# Patient Record
Sex: Male | Born: 1970 | Hispanic: Yes | Smoking: Never smoker
Health system: Southern US, Community
[De-identification: ages and names within clinical notes are randomized; demographics above are authoritative.]

---

## 2015-09-24 ENCOUNTER — Emergency Department: Payer: Self-pay

## 2015-09-24 ENCOUNTER — Inpatient Hospital Stay
Admit: 2015-09-24 | Discharge: 2015-10-03 | DRG: 885 | Disposition: A | Discharge status: Home / Self Care | Payer: Self-pay | Source: Ambulatory Visit | Attending: Psychiatry | Admitting: Psychiatry

## 2015-09-24 ENCOUNTER — Emergency Department
Admission: EM | Admit: 2015-09-24 | Discharge: 2015-09-24 | Disposition: A | Discharge status: Other Acute Inpatient Hospital | Payer: Self-pay | Attending: Emergency Medicine | Admitting: Emergency Medicine

## 2015-09-24 DIAGNOSIS — F515 Nightmare disorder: Secondary | ICD-10-CM | POA: Diagnosis present

## 2015-09-24 DIAGNOSIS — I10 Essential (primary) hypertension: Secondary | ICD-10-CM

## 2015-09-24 DIAGNOSIS — R451 Restlessness and agitation: Secondary | ICD-10-CM | POA: Diagnosis present

## 2015-09-24 DIAGNOSIS — F319 Bipolar disorder, unspecified: Principal | ICD-10-CM | POA: Diagnosis present

## 2015-09-24 DIAGNOSIS — R000 Tachycardia, unspecified: Secondary | ICD-10-CM | POA: Diagnosis present

## 2015-09-24 DIAGNOSIS — F3112 Bipolar disorder, current episode manic without psychotic features, moderate: Secondary | ICD-10-CM

## 2015-09-24 DIAGNOSIS — G4700 Insomnia, unspecified: Secondary | ICD-10-CM | POA: Diagnosis present

## 2015-09-24 DIAGNOSIS — F29 Unspecified psychosis not due to a substance or known physiological condition: Secondary | ICD-10-CM | POA: Insufficient documentation

## 2015-09-24 DIAGNOSIS — R410 Disorientation, unspecified: Secondary | ICD-10-CM | POA: Insufficient documentation

## 2015-09-24 LAB — COMPREHENSIVE METABOLIC PANEL
ALK PHOS: 81 U/L (ref 38–126)
ALT: 53 U/L (ref 17–63)
ANION GAP: 10 (ref 5–15)
AST: 49 U/L — ABNORMAL HIGH (ref 15–41)
Albumin: 5.1 g/dL — ABNORMAL HIGH (ref 3.5–5.0)
BILIRUBIN TOTAL: 1.1 mg/dL (ref 0.3–1.2)
BUN: 11 mg/dL (ref 6–20)
CALCIUM: 9.2 mg/dL (ref 8.9–10.3)
CO2: 24 mmol/L (ref 22–32)
Chloride: 106 mmol/L (ref 101–111)
Creatinine, Ser: 0.71 mg/dL (ref 0.61–1.24)
GFR calc non Af Amer: >60 mL/min (ref >60)
Glucose, Bld: 110 mg/dL — ABNORMAL HIGH (ref 65–99)
Potassium: 3.1 mmol/L — ABNORMAL LOW (ref 3.5–5.1)
Sodium: 140 mmol/L (ref 135–145)
TOTAL PROTEIN: 8.6 g/dL — AB (ref 6.5–8.1)

## 2015-09-24 LAB — CBC WITH DIFFERENTIAL/PLATELET
BASOS ABS: 0 10*3/uL (ref 0–0.1)
BASOS PCT: 0 %
Eosinophils Absolute: 0 10*3/uL (ref 0–0.7)
Eosinophils Relative: 0 %
HEMATOCRIT: 48 % (ref 40.0–52.0)
HEMOGLOBIN: 15.9 g/dL (ref 13.0–18.0)
Lymphocytes Relative: 6 %
Lymphs Abs: 0.7 10*3/uL — ABNORMAL LOW (ref 1.0–3.6)
MCH: 28.4 pg (ref 26.0–34.0)
MCHC: 33.1 g/dL (ref 32.0–36.0)
MCV: 85.6 fL (ref 80.0–100.0)
Monocytes Absolute: 0.3 10*3/uL (ref 0.2–1.0)
Monocytes Relative: 3 %
NEUTROS ABS: 10.5 10*3/uL — AB (ref 1.4–6.5)
NEUTROS PCT: 91 %
Platelets: 242 10*3/uL (ref 150–440)
RBC: 5.6 MIL/uL (ref 4.40–5.90)
RDW: 14.2 % (ref 11.5–14.5)
WBC: 11.6 10*3/uL — ABNORMAL HIGH (ref 3.8–10.6)

## 2015-09-24 LAB — URINALYSIS COMPLETE WITH MICROSCOPIC (ARMC ONLY)
BACTERIA UA: NONE SEEN
BILIRUBIN URINE: NEGATIVE
Glucose, UA: NEGATIVE mg/dL
LEUKOCYTES UA: NEGATIVE
NITRITE: NEGATIVE
PROTEIN: NEGATIVE mg/dL
SPECIFIC GRAVITY, URINE: 1.006 (ref 1.005–1.030)
Squamous Epithelial / LPF: NONE SEEN
pH: 6 (ref 5.0–8.0)

## 2015-09-24 LAB — URINE DRUG SCREEN, QUALITATIVE (ARMC ONLY)
Amphetamines, Ur Screen: NOT DETECTED
BARBITURATES, UR SCREEN: NOT DETECTED
Benzodiazepine, Ur Scrn: NOT DETECTED
COCAINE METABOLITE, UR ~~LOC~~: NOT DETECTED
Cannabinoid 50 Ng, Ur ~~LOC~~: NOT DETECTED
MDMA (ECSTASY) UR SCREEN: NOT DETECTED
METHADONE SCREEN, URINE: NOT DETECTED
Opiate, Ur Screen: NOT DETECTED
Phencyclidine (PCP) Ur S: NOT DETECTED
TRICYCLIC, UR SCREEN: NOT DETECTED

## 2015-09-24 LAB — ETHANOL: Alcohol, Ethyl (B): <5 mg/dL (ref <5)

## 2015-09-24 LAB — GLUCOSE, CAPILLARY: Glucose-Capillary: 114 mg/dL — ABNORMAL HIGH (ref 65–99)

## 2015-09-24 MED ORDER — LORAZEPAM 1 MG PO TABS: 1.0000 mg | ORAL_TABLET | ORAL | Status: DC | PRN

## 2015-09-24 MED ORDER — SODIUM CHLORIDE 0.9 % IV SOLN
1000.0000 mL | Freq: Once | INTRAVENOUS | Status: AC
  Administered 2015-09-24: 1000 mL via INTRAVENOUS

## 2015-09-24 MED ORDER — LISINOPRIL 5 MG PO TABS: 5.0000 mg | ORAL_TABLET | Freq: Every day | ORAL | Status: DC

## 2015-09-24 MED ORDER — HALOPERIDOL LACTATE 5 MG/ML IJ SOLN
5.0000 mg | Freq: Once | INTRAMUSCULAR | Status: AC
  Administered 2015-09-24: 5 mg via INTRAMUSCULAR

## 2015-09-24 MED ORDER — LORAZEPAM 2 MG PO TABS
2.0000 mg | ORAL_TABLET | Freq: Once | ORAL | Status: AC
  Administered 2015-09-24: 2 mg via ORAL

## 2015-09-24 MED ORDER — HALOPERIDOL 0.5 MG PO TABS: 2.0000 mg | ORAL_TABLET | Freq: Two times a day (BID) | ORAL | Status: DC

## 2015-09-24 MED ORDER — HALOPERIDOL LACTATE 5 MG/ML IJ SOLN
INTRAMUSCULAR | Status: AC
  Filled 2015-09-24: qty 1

## 2015-09-24 MED ORDER — BENZTROPINE MESYLATE 0.5 MG PO TABS: 0.5000 mg | ORAL_TABLET | Freq: Two times a day (BID) | ORAL | Status: DC | PRN

## 2015-09-24 MED ORDER — LORAZEPAM 2 MG PO TABS
ORAL_TABLET | ORAL | Status: AC
  Administered 2015-09-24: 2 mg via ORAL
  Filled 2015-09-24: qty 1

## 2015-09-24 NOTE — ED Notes (Addendum)
Brought in by sheriff , called bc pt was seen jumping on his car and picking at the grass.  Daughter reports to police that pt get "phychosis from a lack of sleep".  Pt denies substance use, PERRL, grips equal.

## 2015-09-24 NOTE — ED Notes (Signed)
Pt states he does not remember jumping on car this morning or being brought in by [employer]. He says that he has had "episodes of psychosis" in the past and is not taking medication. Pt repeatedly asking for his wife, when asked what wife's name is, pt gives name of nurse written on board in his room.

## 2015-09-24 NOTE — Tx Team (Signed)
Initial Interdisciplinary Treatment Plan   PATIENT STRESSORS: Financial difficulties Medication change or noncompliance   PATIENT STRENGTHS: Physical Health Supportive family/friends   PROBLEM LIST: Problem List/Patient Goals Date to be addressed Date deferred Reason deferred Estimated date of resolution  Anxiety 09/24/2015                                                      DISCHARGE CRITERIA:  Ability to meet basic life and health needs Verbal commitment to aftercare and medication compliance  PRELIMINARY DISCHARGE PLAN: Return to previous living arrangement  PATIENT/FAMIILY INVOLVEMENT: This treatment plan has been presented to and reviewed with the patient, Shelba Flakeedro Silvio Mocarski..  The patient and family have been given the opportunity to ask questions and make suggestions.  Eligh Rybacki W Antario Yasuda 09/24/2015, 6:29 PM

## 2015-09-24 NOTE — Progress Notes (Signed)
Patient arrived on the unit around 1720 this evening. Upon assessment, patient noted to be really sleepy/sedated, stated that he was tired and preferred to sleep. An interpreter was present during assessment as patient is Spanish speaking only. He denied SI/HI AND AH. He remained cooperative with most part of the assessment and kept falling asleep from time to time. Patient also verbalized Anxiety as one of his major issues. Patient's belongings(clothing) searched and no contraband noted. Patient visited with spouse and sister after assessment.

## 2015-09-24 NOTE — ED Provider Notes (Signed)
[facility] Emergency Department Provider Note  ____________________________________________  Time seen: On arrival  I have reviewed the triage vital signs and the nursing notes.   HISTORY  Chief Complaint Altered Mental Status  Limited history secondary to altered mental status and Spanish-speaking, Spanish interpreter used Trudy  HPI Nathaniel Yates is a 45 y.o. male who is brought in by the [facility] reportedly for bizarre behavior. Reportedly he was to become down on top of his car and then picking grass by the roots. The sheriff noted that they spoke to the daughter who says that he sometimes gets psychosis from lack of sleep. Patient has denied alcohol or drugs last night.     No past medical history on file.  There are no active problems to display for this patient.   No past surgical history on file.  No current outpatient prescriptions on file.  Allergies Review of patient's allergies indicates not on file.  No family history on file.  Social History Social History  Substance Use Topics  . Smoking status: Not on file  . Smokeless tobacco: Not on file  . Alcohol Use: Not on file    Review of Systems  Constitutional: denies fever. Eyes: denies visual changes. ENT: Negative for sore throat Cardiovascular: Negative for chest pain. Respiratory: Negative for shortness of breath. Gastrointestinal: Negative for abdominal pain, vomiting and diarrhea. Genitourinary: Negative for dysuria. Musculoskeletal: Negative for back pain. Skin: Negative for rash. Neurological: Negative for headaches or focal weakness Psychiatric: Confusion    ____________________________________________   PHYSICAL EXAM:  VITAL SIGNS: ED Triage Vitals  Enc Vitals Group     BP 09/24/15 1056 153/97 mmHg     Pulse Rate 09/24/15 1056 115     Resp 09/24/15 1056 20     Temp 09/24/15 1056 98.5 F (36.9 C)     Temp Source 09/24/15 1056 Oral     SpO2 09/24/15  1056 98 %     Weight 09/24/15 1056 190 lb (86.183 kg)     Height 09/24/15 1056 5\' 7"  (1.702 m)     Head Cir --      Peak Flow --      Pain Score --      Pain Loc --      Pain Edu? --      Excl. in GC? --     Constitutional: Alert but confused, no acute distress Eyes: Conjunctivae are normal.  ENT   Head: Normocephalic and atraumatic.   Mouth/Throat: Mucous membranes are moist. Cardiovascular: Normal rate, regular rhythm. Normal and symmetric distal pulses are present in all extremities. No murmurs, rubs, or gallops. Respiratory: Normal respiratory effort without tachypnea nor retractions. Breath sounds are clear and equal bilaterally.  Gastrointestinal: Soft and non-tender in all quadrants. No distention. There is no CVA tenderness. Genitourinary: deferred Musculoskeletal: Nontender with normal range of motion in all extremities. No lower extremity tenderness nor edema. Neurologic:  Normal speech and language. No gross focal neurologic deficits are appreciated. Skin:  Skin is warm, dry and intact. No rash noted. Psychiatric: Mood and affect are normal. Patient is clearly confused as he feels like he is here because of a dog  ____________________________________________    LABS (pertinent positives/negatives)  Labs Reviewed  GLUCOSE, CAPILLARY - Abnormal; Notable for the following:    Glucose-Capillary 114 (*)    All other components within normal limits  COMPREHENSIVE METABOLIC PANEL - Abnormal; Notable for the following:    Potassium 3.1 (*)  Glucose, Bld 110 (*)    Total Protein 8.6 (*)    Albumin 5.1 (*)    AST 49 (*)    All other components within normal limits  CBC WITH DIFFERENTIAL/PLATELET - Abnormal; Notable for the following:    WBC 11.6 (*)    Neutro Abs 10.5 (*)    Lymphs Abs 0.7 (*)    All other components within normal limits  URINALYSIS COMPLETEWITH MICROSCOPIC ([facility] ONLY) - Abnormal; Notable for the following:    Color, Urine STRAW (*)     APPearance CLEAR (*)    Ketones, ur 1+ (*)    Hgb urine dipstick 1+ (*)    All other components within normal limits  ETHANOL  URINE DRUG SCREEN, QUALITATIVE ([facility] ONLY)    ____________________________________________   EKG  None ____________________________________________    RADIOLOGY I have personally reviewed any xrays that were ordered on this patient: CT head unremarkable  ____________________________________________   PROCEDURES  Procedure(s) performed: none  Critical Care performed: none  ____________________________________________   INITIAL IMPRESSION / ASSESSMENT AND PLAN / ED COURSE  Pertinent labs & imaging results that were available during my care of the patient were reviewed by me and considered in my medical decision making (see chart for details).  Patient with confusion/psychosis most likely due to psychiatric illness. He does appear to have a history of this. He is attempting to leave but he is unsafe to do so and I will involuntarily commit him. I'll have psych See him. Lab work is normal, imaging is normal  ____________________________________________   FINAL CLINICAL IMPRESSION(S) / ED DIAGNOSES  Final diagnoses:  Psychosis, unspecified psychosis type     Jene Everyobert Krystalyn Kubota, MD 09/24/15 1457

## 2015-09-24 NOTE — ED Notes (Addendum)
Pt pulled IV out, tip intact. MD notified. Bleeding controlled, gauze dressing and tape applied.

## 2015-09-24 NOTE — ED Notes (Signed)
Pt's emergency contact is Champ Mungoorma Rivas Carballo [phone] (cell) [significant other].   Via interpreter, pt's EC stated that pt has been seen for behavioral health problems in [place] where pt lives. She states that he relapsed soon after being discharged and that the medication he received from [facility] made his episodes worse. States that pt was taking a medication he got in [place] that successfully controlled symptoms for the past 3-3.5 years but she suspects he stopped taking it, she does not know the name of it. Family states they did not know how far pt had driven and didn't know pt was in [place] until the police dept told them. She in en route to see pt now, about 2 hours away.

## 2015-09-24 NOTE — ED Notes (Signed)
5 mg Haldol given L upper outer quadrant

## 2015-09-24 NOTE — BH Assessment (Signed)
Per Charge Nurse [facility] Bed [facility].  Dr. Toni Amendlapacs is the accepting doctor.

## 2015-09-24 NOTE — Consult Note (Signed)
[facility] Face-to-Face Psychiatry Consult   Reason for Consult:  Consult for this 45 year old man who was brought to the emergency room by law enforcement who found him acting bizarrely in public exhibiting psychotic symptoms. Referring Physician:  Corky Downs Patient Identification: Nathaniel Yates MRN:  [id number] Principal Diagnosis: Psychosis Diagnosis:   Patient Active Problem List   Diagnosis Date Noted  . Psychosis [F29] 09/24/2015  . Language barrier [Z78.9] 09/24/2015  . Hypertension [I10] 09/24/2015    Total Time spent with patient: 1 hour  Subjective:   Nathaniel Yates is a 45 y.o. male patient admitted with patient is unable to offer any specific chief complaint.  HPI:  This 45 year old man was brought to the emergency room by the police who found him somewhere out in public in the area standing up on top of his car apparently jumping up and down acting bizarrely. They report that they contacted his family and were told that he has a history of psychotic episodes. We have not been able to reach his family yet. He couldn't give me a number and I don't see one in the chart. Patient was interviewed with the assistance of a professional Spanish language interpreter. He tells me that he is aware of having an episode and that has been going on for about 3 days. He tells me he's been having stress at work but is unwilling to specify what kind of stress. His work apparently is at [employer]. He indicates that he has not been sleeping well without much detail. He indicates that he is having auditory hallucinations and for much of the time he is responding to his hallucinations waving his arms around that he says he is conducting music when he does it. Patient is unable to identify any other particular stress. He says he has not been drinking recently and not been abusing any drugs. He is not able to answer me whether he is supposed to be taking any kind of psychiatric or other medication normally.  According to his identification the patient lives in [place]. Laboratory results show a slight elevation in his glucose otherwise unremarkable no positive drug screen no alcohol.  Social history: Evidently he lives in [place] and also it sounds like police were able to contact someone in his family. Nursing has been told that family is supposed to be on their way here to see him. He tells me he has a wife named Nathaniel Yates and 2 children ages 16 and 33. He tells me that he works doing multiple jobs at [employer]. Patient indicates that he has had past psychiatric treatment in [place] presumably his country of origin.  Medical history: Patient is not able to give me much history about that. His blood pressure is elevated and he seemed to be indicating in the positive that he had some blood pressure problems. Wasn't able to tell me about any other medical problems. Wasn't able to tell me any medicines he was taking.  Patient indicates that he drinks about one drink a week says he hasn't been increasing or using any alcohol recently. Denies any other drug use. No sign of alcohol or drug abuse on his laboratory results.    Past Psychiatric History: Patient indicated in the positive that he had had past psychiatric hospitalizations in [place] and also suggested that he has had a psychiatric hospitalization at [facility] in [place]. Judging from his home address that is most likely [facility] in [place]. I  should emphasize that getting history from him as very difficult. He often answers with non-sequiturs or goes long periods of time before answering and only answers a few of the questions we ask him. Patient denies that he is ever tried to kill himself. Denies ever being violent anyone else. Can't tell me a name of any diagnosis he has been given in the past. He seemed to indicate that his symptoms are intermittent and not constant.  Risk to Self:   yes because of acute  psychosis and unpredictability Risk to Others:   unclear no evidence that he is acutely at risk to others Prior Inpatient Therapy:   details unclear but he seems to indicate at least one or 2 prior hospitalizations Prior Outpatient Therapy:   patient is unable to give much history about this  Past Medical History: No past medical history on file. No past surgical history on file. Family History: No family history on file. Family Psychiatric  History: Again he is not very good with a history but he seemed to indicate that his brother might have some kind of mental health problem Social History:  History  Alcohol Use: Not on file     History  Drug Use Not on file    Social History   Social History  . Marital Status: Unknown    Spouse Name: N/A  . Number of Children: N/A  . Years of Education: N/A   Social History Main Topics  . Smoking status: Not on file  . Smokeless tobacco: Not on file  . Alcohol Use: Not on file  . Drug Use: Not on file  . Sexual Activity: Not on file   Other Topics Concern  . Not on file   Social History Narrative  . No narrative on file   Additional Social History:                          Allergies:  Allergies not on file  Labs:  Results for orders placed or performed during the hospital encounter of 09/24/15 (from the past 48 hour(s))  Glucose, capillary     Status: Abnormal   Collection Time: 09/24/15 11:03 AM  Result Value Ref Range   Glucose-Capillary 114 (H) 65 - 99 mg/dL  Comprehensive metabolic panel     Status: Abnormal   Collection Time: 09/24/15 12:20 PM  Result Value Ref Range   Sodium 140 135 - 145 mmol/L   Potassium 3.1 (L) 3.5 - 5.1 mmol/L   Chloride 106 101 - 111 mmol/L   CO2 24 22 - 32 mmol/L   Glucose, Bld 110 (H) 65 - 99 mg/dL   BUN 11 6 - 20 mg/dL   Creatinine, Ser 0.71 0.61 - 1.24 mg/dL   Calcium 9.2 8.9 - 10.3 mg/dL   Total Protein 8.6 (H) 6.5 - 8.1 g/dL   Albumin 5.1 (H) 3.5 - 5.0 g/dL   AST 49 (H) 15 -  41 U/L   ALT 53 17 - 63 U/L   Alkaline Phosphatase 81 38 - 126 U/L   Total Bilirubin 1.1 0.3 - 1.2 mg/dL   GFR calc non Af Amer >60 >60 mL/min   GFR calc Af Amer >60 >60 mL/min    Comment: (NOTE) The eGFR has been calculated using the CKD EPI equation. This calculation has not been validated in all clinical situations. eGFR's persistently <60 mL/min signify possible Chronic Kidney Disease.    Anion gap 10 5 - 15  CBC WITH DIFFERENTIAL     Status: Abnormal   Collection Time: 09/24/15 12:20 PM  Result Value Ref Range   WBC 11.6 (H) 3.8 - 10.6 K/uL   RBC 5.60 4.40 - 5.90 MIL/uL   Hemoglobin 15.9 13.0 - 18.0 g/dL   HCT 48.0 40.0 - 52.0 %   MCV 85.6 80.0 - 100.0 fL   MCH 28.4 26.0 - 34.0 pg   MCHC 33.1 32.0 - 36.0 g/dL   RDW 14.2 11.5 - 14.5 %   Platelets 242 150 - 440 K/uL   Neutrophils Relative % 91 %   Neutro Abs 10.5 (H) 1.4 - 6.5 K/uL   Lymphocytes Relative 6 %   Lymphs Abs 0.7 (L) 1.0 - 3.6 K/uL   Monocytes Relative 3 %   Monocytes Absolute 0.3 0.2 - 1.0 K/uL   Eosinophils Relative 0 %   Eosinophils Absolute 0.0 0 - 0.7 K/uL   Basophils Relative 0 %   Basophils Absolute 0.0 0 - 0.1 K/uL  Ethanol     Status: None   Collection Time: 09/24/15 12:20 PM  Result Value Ref Range   Alcohol, Ethyl (B) <5 <5 mg/dL    Comment:        LOWEST DETECTABLE LIMIT FOR SERUM ALCOHOL IS 5 mg/dL FOR MEDICAL PURPOSES ONLY   Urine Drug Screen, Qualitative ([facility] only)     Status: None   Collection Time: 09/24/15  1:10 PM  Result Value Ref Range   Tricyclic, Ur Screen NONE DETECTED NONE DETECTED   Amphetamines, Ur Screen NONE DETECTED NONE DETECTED   MDMA (Ecstasy)Ur Screen NONE DETECTED NONE DETECTED   Cocaine Metabolite,Ur [place] NONE DETECTED NONE DETECTED   Opiate, Ur Screen NONE DETECTED NONE DETECTED   Phencyclidine (PCP) Ur S NONE DETECTED NONE DETECTED   Cannabinoid 50 Ng, Ur [place] NONE DETECTED NONE DETECTED   Barbiturates, Ur Screen NONE DETECTED NONE DETECTED   Benzodiazepine, Ur  Scrn NONE DETECTED NONE DETECTED   Methadone Scn, Ur NONE DETECTED NONE DETECTED    Comment: (NOTE) 683  Tricyclics, urine               Cutoff 1000 ng/mL 200  Amphetamines, urine             Cutoff 1000 ng/mL 300  MDMA (Ecstasy), urine           Cutoff 500 ng/mL 400  Cocaine Metabolite, urine       Cutoff 300 ng/mL 500  Opiate, urine                   Cutoff 300 ng/mL 600  Phencyclidine (PCP), urine      Cutoff 25 ng/mL 700  Cannabinoid, urine              Cutoff 50 ng/mL 800  Barbiturates, urine             Cutoff 200 ng/mL 900  Benzodiazepine, urine           Cutoff 200 ng/mL 1000 Methadone, urine                Cutoff 300 ng/mL 1100 1200 The urine drug screen provides only a preliminary, unconfirmed 1300 analytical test result and should not be used for non-medical 1400 purposes. Clinical consideration and professional judgment should 1500 be applied to any positive drug screen result due to possible 1600 interfering substances. A more specific alternate chemical method 1700 must be used in order to obtain a confirmed analytical result.  1800 Gas chromato graphy / mass spectrometry (GC/MS) is the preferred 1900 confirmatory method.   Urinalysis complete, with microscopic ([facility] only)     Status: Abnormal   Collection Time: 09/24/15  1:10 PM  Result Value Ref Range   Color, Urine STRAW (A) YELLOW   APPearance CLEAR (A) CLEAR   Glucose, UA NEGATIVE NEGATIVE mg/dL   Bilirubin Urine NEGATIVE NEGATIVE   Ketones, ur 1+ (A) NEGATIVE mg/dL   Specific Gravity, Urine 1.006 1.005 - 1.030   Hgb urine dipstick 1+ (A) NEGATIVE   pH 6.0 5.0 - 8.0   Protein, ur NEGATIVE NEGATIVE mg/dL   Nitrite NEGATIVE NEGATIVE   Leukocytes, UA NEGATIVE NEGATIVE   RBC / HPF 0-5 0 - 5 RBC/hpf   WBC, UA 0-5 0 - 5 WBC/hpf   Bacteria, UA NONE SEEN NONE SEEN   Squamous Epithelial / LPF NONE SEEN NONE SEEN   Mucous PRESENT    Hyaline Casts, UA PRESENT     Current Facility-Administered Medications   Medication Dose Route Frequency Provider Last Rate Last Dose  . benztropine (COGENTIN) tablet 0.5 mg  0.5 mg Oral BID PRN Gonzella Lex, MD      . haloperidol (HALDOL) tablet 2 mg  2 mg Oral BID Gonzella Lex, MD      . haloperidol lactate (HALDOL) 5 MG/ML injection            No current outpatient prescriptions on file.    Musculoskeletal: Strength & Muscle Tone: within normal limits Gait & Station: normal Patient leans: N/A  Psychiatric Specialty Exam: Review of Systems  Constitutional: Negative.   HENT: Negative.   Eyes: Negative.   Respiratory: Negative.   Cardiovascular: Negative.   Gastrointestinal: Negative.   Musculoskeletal: Negative.   Skin: Negative.   Neurological: Negative.   Psychiatric/Behavioral: Positive for depression, hallucinations and memory loss. Negative for suicidal ideas and substance abuse. The patient is nervous/anxious and has insomnia.     Blood pressure 153/97, pulse 115, temperature 98.5 F (36.9 C), temperature source Oral, resp. rate 20, height _0  (1.702 m), weight 86.183 kg (190 lb), SpO2 98 %.Body mass index is 29.75 kg/(m^2).  General Appearance: Fairly Groomed  Engineer, water::  Minimal  Speech:  Slow  Volume:  Decreased  Mood:  Depressed  Affect:  Flat  Thought Process:  Very minimal, telegraphic, slightly disorganized  Orientation:  Other:  He knew that he was in [place] but couldn't answer any other questions  Thought Content:  Hallucinations: Auditory  Suicidal Thoughts:  No  Homicidal Thoughts:  No  Memory:  Immediate;   Good Recent;   Fair Remote;   Fair  Judgement:  Impaired  Insight:  Present  Psychomotor Activity:  Decreased  Concentration:  Poor  Recall:  [employer] of Knowledge:Fair  Language: Fair  Akathisia:  No  Handed:  Right  AIMS (if indicated):     Assets:  Desire for Improvement Housing Physical Health Resilience Social Support  ADL's:  Intact  Cognition: WNL  Sleep:      Treatment Plan  Summary: Daily contact with patient to assess and evaluate symptoms and progress in treatment, Medication management and Plan This is a 45 year old man who presents with what appears to be a acute or subacute psychosis. In the emergency room he is mostly lying still staring at the ceiling and then occasionally raises his arms to point at something in the air or make odd gestures. The last time I saw him he was  sitting up and waving his arms as though conducting music. He has not been aggressive or hostile or difficult to manage. Patient denies suicidal ideation. He is currently very limited in his responses and at times seems nearly catatonic. Patient requires hospitalization because of psychosis with poor self-care. Labs reviewed. No sign of any acute major medical problems to contraindicate admission. Blood pressure slightly elevated. Some blood glucose elevation and glucose in his urine noticed. No sign of substance abuse. CAT scan negative. Patient will be admitted to the psychiatry service. I will request that we try to get some prior history information from [facility] if possible. Treatment team can continue trying to reach his family who hopefully really are on their way over here. I informed the patient that because he could not tell me any other medicine he had been on that I will start him on a low-dose of Haldol. He indicated that that was fine with him. I couldn't tell if he recognized the name of the medicine for certain or not. The checks in place. Labs including hemoglobin A1c TSH and lipid panel will be done. When necessary medicine will be provided in case of agitation have also provided a low dose of Cogentin to go with the Haldol. 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  Disposition: Recommend psychiatric Inpatient  admission when medically cleared. Supportive therapy provided about ongoing stressors.  Nathaniel Yates 09/24/2015 3:48 PM

## 2015-09-24 NOTE — BH Assessment (Addendum)
Per Dr. Toni Amendlapacs, patient meets criteria for [facility]pt hospitalization at [facility].  Writer informed Merchandiser, retailthe Charge Nurse and provided him with the referral information.  Writer waiting on a bed assignment.

## 2015-09-25 ENCOUNTER — Encounter: Payer: Self-pay | Admitting: Psychiatry

## 2015-09-25 DIAGNOSIS — F3112 Bipolar disorder, current episode manic without psychotic features, moderate: Secondary | ICD-10-CM

## 2015-09-25 LAB — LIPID PANEL
CHOLESTEROL: 188 mg/dL (ref 0–200)
HDL: 55 mg/dL (ref >40)
LDL Cholesterol: 122 mg/dL — ABNORMAL HIGH (ref 0–99)
TRIGLYCERIDES: 55 mg/dL (ref <150)
Total CHOL/HDL Ratio: 3.4 RATIO
VLDL: 11 mg/dL (ref 0–40)

## 2015-09-25 LAB — TSH: TSH: 1.119 u[IU]/mL (ref 0.350–4.500)

## 2015-09-25 LAB — HEMOGLOBIN A1C: HEMOGLOBIN A1C: 5.6 % (ref 4.0–6.0)

## 2015-09-25 MED ORDER — LORAZEPAM 2 MG PO TABS
2.0000 mg | ORAL_TABLET | Freq: Every day | ORAL | Status: DC
  Administered 2015-09-25 – 2015-10-02 (×8): 2 mg via ORAL
  Filled 2015-09-25 (×9): qty 1

## 2015-09-25 MED ORDER — PALIPERIDONE ER 3 MG PO TB24
6.0000 mg | ORAL_TABLET | Freq: Every day | ORAL | Status: DC
  Administered 2015-09-25 – 2015-09-26 (×2): 6 mg via ORAL
  Filled 2015-09-25 (×2): qty 2

## 2015-09-25 MED ORDER — ALUM & MAG HYDROXIDE-SIMETH 200-200-20 MG/5ML PO SUSP: 30.0000 mL | ORAL | Status: DC | PRN

## 2015-09-25 MED ORDER — LORAZEPAM 1 MG PO TABS
1.0000 mg | ORAL_TABLET | ORAL | Status: DC | PRN
Start: 2015-09-25 — End: 2015-09-26

## 2015-09-25 MED ORDER — MAGNESIUM HYDROXIDE 400 MG/5ML PO SUSP: 30.0000 mL | Freq: Every day | ORAL | Status: DC | PRN

## 2015-09-25 MED ORDER — HALOPERIDOL 2 MG PO TABS
2.0000 mg | ORAL_TABLET | Freq: Two times a day (BID) | ORAL | Status: DC
  Administered 2015-09-25: 2 mg via ORAL
  Filled 2015-09-25: qty 1

## 2015-09-25 MED ORDER — LISINOPRIL 5 MG PO TABS
5.0000 mg | ORAL_TABLET | Freq: Every day | ORAL | Status: DC
  Administered 2015-09-25 – 2015-09-26 (×2): 5 mg via ORAL
  Filled 2015-09-25 (×2): qty 1

## 2015-09-25 MED ORDER — ACETAMINOPHEN 325 MG PO TABS
650.0000 mg | ORAL_TABLET | Freq: Four times a day (QID) | ORAL | Status: DC | PRN
  Administered 2015-09-27: 650 mg via ORAL
  Filled 2015-09-25: qty 2

## 2015-09-25 MED ORDER — BENZTROPINE MESYLATE 1 MG PO TABS: 0.5000 mg | ORAL_TABLET | Freq: Two times a day (BID) | ORAL | Status: DC | PRN

## 2015-09-25 NOTE — Progress Notes (Signed)
D:  Per pt self inventory pt reports sleeping good, appetite good, energy level up and down, ability to pay attention good, rates depression at a 0 out of 10 hopelessness at a 10 out of 10, anxiety "it varies", denies SI/HI/AVH, pacing hallways, reports feeling anxious at times.      A:  Emotional support provided, Encouraged pt to continue with treatment plan and attend all group activities, q15 min checks maintained for safety.  R:  Pt is going to groups, bizarre during interaction, some disorganized thoughts, asking about "how much a ticket to [place] costs, I want to go visit family in [place]", med compliant at this time.

## 2015-09-25 NOTE — [facility] Group Notes (Signed)
[facility] LCSW Group Therapy   09/25/2015 1 pm Type of Therapy: Group Therapy   Participation Level: Minimal   Participation Quality: Attentive at times  Affect: Nervous and agitated  Cognitive: Alert and Oriented   Insight: Developing/Improving and Engaged   Engagement in Therapy: Developing/Improving and Engaged   Modes of Intervention: Clarification, Confrontation, Discussion, Education, Exploration,  Limit-setting, Orientation, Problem-solving, Rapport Building, Dance movement psychotherapisteality Testing, Socialization and Support   Summary of Progress/Problems: Pt identified obstacles faced currently and processed barriers involved in overcoming these obstacles. Pt identified steps necessary for overcoming these obstacles and explored motivation (internal and external) for facing these difficulties head on. Pt further identified one area of concern in their lives and chose a goal to focus on for today. Pt was at times, though the services of an interpreter, focused and attentive to the topics discussed and the sharing of others and other times presented as nervous and anxious and would get up and leave the room, return and sit back down. This happened four times before the pt did not return. Pt did not share, but was attentive to the interpreter's words.  Dorothe PeaJonathan F. Zamarian Scarano, LCSWA, LCAS  1.2/17
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
## 2015-09-25 NOTE — [facility] Group Notes (Signed)
[facility] Group Notes:  (Nursing/MHT/Case Management/Adjunct)  Date:  09/25/2015  Time:  12:18 PM  Type of Therapy:  Psychoeducational Skills  Participation Level:  Minimal  Participation Quality:  Attentive  Affect:  Appropriate  Cognitive:  Disorganized  Insight:  Lacking  Engagement in Group:  Lacking and Limited  Modes of Intervention:  Discussion and Education  Summary of Progress/Problems:  Nathaniel Yates M Nathaniel Yates 09/25/2015, 12:18 PM

## 2015-09-25 NOTE — Progress Notes (Signed)
Patient pleasant during shift. Patient paced unit and tried all the doors. Patient had to be redirected several times as he was going into other patient's rooms, walking in hallway with only a towel on, racing his wheel chair, being out of area. Patient compliant with redirection. Q 15 min checks maintained for safety. The patient speaks some English but attention span is very short. Unable to fully assess. Will continue to monitor.

## 2015-09-25 NOTE — [facility] Group Notes (Signed)
[facility]   09/25/2015 9:15 AM  ?  Participation Quality: Alert, Appropriate and Oriented   Mood/Affect: At times appropriate and at times anxious  Depression Rating: Did not report  Anxiety Rating: Did not report  Thoughts of Suicide: Pt denies SI/HI   Will you contract for safety? Yes   Current AVH: Pt denies   Plan for Discharge/Comments: Pt attended discharge planning group and actively participated in group. CSW provided pt with today's workbook. Pt was at times, though the services of an interpreter, focused and attentive to the topics of others and other times presented as nervous and anxious and would get up and leave the room, return and sit back down.  This happened three times before the pt did not return.   Transportation Means: Did not report   Supports: No supports mentioned at this time     Dorothe PeaJonathan F. Jamarques Pinedo, MSW, LCSWA, LCAS

## 2015-09-25 NOTE — [facility] Suicide Risk Assessment (Signed)
[facility] Admission Suicide Risk Assessment   [facility]    Demographic factors:    Current Mental Status:    Loss Factors:    Historical Factors:    Risk Reduction Factors:    Total Time spent with patient: 1 hour Principal Problem: Bipolar 1 disorder, manic, moderate (HCC) Diagnosis:   Patient Active Problem List   Diagnosis Date Noted  . Bipolar 1 disorder, manic, moderate (HCC) [F31.12] 09/25/2015  . Hypertension [I10] 09/24/2015     Continued Clinical Symptoms:  Alcohol Use Disorder Identification Test Final Score (AUDIT): 0 The "Alcohol Use Disorders Identification Test", Guidelines for Use in Primary Care, Second Edition.  World Science writerHealth Organization [facility](WHO). Score between 0-7:  no or low risk or alcohol related problems. Score between 8-15:  moderate risk of alcohol related problems. Score between 16-19:  high risk of alcohol related problems. Score 20 or above:  warrants further diagnostic evaluation for alcohol dependence and treatment.   CLINICAL FACTORS:   Severe Anxiety and/or Agitation Previous Psychiatric Diagnoses and Treatments    Psychiatric Specialty Exam: Physical Exam  ROS  Blood pressure 144/69, pulse 118, temperature 98.4 F (36.9 C), temperature source Oral, resp. rate 18, height 5\' 7"  (1.702 m), weight 86.1 kg (189 lb 13.1 oz), SpO2 98 %.Body mass index is 29.72 kg/(m^2).   COGNITIVE FEATURES THAT CONTRIBUTE TO RISK:  Closed-mindedness    SUICIDE RISK:   Moderate:  Frequent suicidal ideation with limited intensity, and duration, some specificity in terms of plans, no associated intent, good self-control, limited dysphoria/symptomatology, some risk factors present, and identifiable protective factors, including available and accessible social support.  PLAN OF CARE: Admit to behavioral health  Medical Decision Making:  Established Problem, Worsening (2)  I certify that inpatient services furnished can reasonably be expected to  improve the patient's condition.   Jimmy FootmanHernandez-Gonzalez,  Artia Singley 09/25/2015, 1:14 PM

## 2015-09-25 NOTE — Plan of Care (Signed)
Problem: Ineffective individual coping Goal: LTG: Patient will report a decrease in negative feelings Outcome: Progressing Pt denies SI/HI/AVH, verbal contract to report SI to staff, remains safe.

## 2015-09-26 MED ORDER — PALIPERIDONE ER 3 MG PO TB24
9.0000 mg | ORAL_TABLET | Freq: Every day | ORAL | Status: DC
  Administered 2015-09-27 – 2015-10-03 (×7): 9 mg via ORAL
  Filled 2015-09-26 (×8): qty 3

## 2015-09-26 MED ORDER — LORAZEPAM 1 MG PO TABS
1.0000 mg | ORAL_TABLET | Freq: Two times a day (BID) | ORAL | Status: DC
Start: 2015-09-26 — End: 2015-09-27
  Administered 2015-09-26 – 2015-09-27 (×3): 1 mg via ORAL
  Filled 2015-09-26 (×3): qty 1

## 2015-09-26 MED ORDER — ATENOLOL 25 MG PO TABS
12.5000 mg | ORAL_TABLET | Freq: Every day | ORAL | Status: DC
  Administered 2015-09-27 – 2015-09-28 (×2): 12.5 mg via ORAL
  Filled 2015-09-26 (×2): qty 1

## 2015-09-26 NOTE — [facility] Group Notes (Signed)
[facility] Group Notes:  (Nursing/MHT/Case Management/Adjunct)  Date:  09/26/2015  Time:  2:24 PM  Type of Therapy:  Psychoeducational Skills  Participation Level:  Did Not Attend  Lynelle SmokeCara Travis Gerald Champion Regional Medical CenterMadoni 09/26/2015, 2:24 PM
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
## 2015-09-26 NOTE — [facility] Group Notes (Signed)
[facility] LCSW Group Therapy   09/26/2015 11:00 am  Type of Therapy: Group Therapy   Participation Level: Active   Participation Quality: Attentive, Sharing and Supportive   Affect: Appropriate  Cognitive: Alert and Oriented   Insight: Developing/Improving and Engaged   Engagement in Therapy: Developing/Improving and Engaged   Modes of Intervention: Clarification, Confrontation, Discussion, Education, Exploration,  Limit-setting, Orientation, Problem-solving, Rapport Building, Dance movement psychotherapisteality Testing, Socialization and Support  Summary of Progress/Problems: The topic for group therapy was feelings about diagnosis. Pt was focused intermittently on group discussion on their past and current diagnosis and how they feel towards this. Pt did identifyhow society and family members judge him, based on his actions, as well as his behaviors.  Pt shared he was ready for discharge and to seek outpatient services upon discharge.  Pt was polite and cooperative with the CSW and other group members and focused and attentive to the topics discussed and the sharing of others.     Dorothe PeaJonathan F. Aalina Brege, LCSWA, LCAS

## 2015-09-26 NOTE — Progress Notes (Signed)
Patient denies SI. Patient states that he is not sad. Patient paces around the unit at times. The patient has periods of wakefulness during sleep hours. The patient is med compliant and pleasant. No unsafe behaviors observed during shift. Q 15 min checks maintained. Will continue to monitor.

## 2015-09-26 NOTE — [facility] Suicide Risk Assessment (Signed)
[facility] INPATIENT:  Family/Significant Other Suicide Prevention Education  Suicide Prevention Education:  Education Completed; with the pt's girlfriend Kennieth Radorma Rivas at ph: [phone],  (name of family member/significant other) has been identified by the patient as the family member/significant other with whom the patient will be residing, and identified as the person(s) who will aid the patient in the event of a mental health crisis (suicidal ideations/suicide attempt).  With written consent from the patient, the family member/significant other has been provided the following suicide prevention education, prior to the and/or following the discharge of the patient.  Completed with pt  The suicide prevention education provided includes the following:  Suicide risk factors  Suicide prevention and interventions  National Suicide Hotline telephone number  [facility] assessment telephone number  [facility] Emergency Assistance 911  [facility] and/or Residential Mobile Crisis Unit telephone number  Request made of family/significant other to:  Remove weapons (e.g., guns, rifles, knives), all items previously/currently identified as safety concern.    Remove drugs/medications (over-the-counter, prescriptions, illicit drugs), all items previously/currently identified as a safety concern.  The family member/significant other verbalizes understanding of the suicide prevention education information provided.  The family member/significant other agrees to remove the items of safety concern listed above.  Dorothe PeaJonathan F Jahmarion Popoff 09/26/2015, 2:56 PM
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
## 2015-09-26 NOTE — Progress Notes (Signed)
Patient with blunted affect, cooperative behavior, noted to pace in hall. Does well with support and encouragement. Writer, patient and interpretor met for assessment. Patient denies SI/HI/AVH at this time. Patient aware to state "Interpretor please" for any concerns, issues or questions. No distress, no complaint. Safety maintained.

## 2015-09-26 NOTE — Plan of Care (Signed)
Problem: Ineffective individual coping Goal: LTG: Patient will report a decrease in negative feelings Outcome: Progressing No SI/HI/AVH at this time. Safety maintained.

## 2015-09-27 MED ORDER — LORAZEPAM 0.5 MG PO TABS
0.5000 mg | ORAL_TABLET | Freq: Two times a day (BID) | ORAL | Status: DC
  Administered 2015-09-28: 0.5 mg via ORAL
  Filled 2015-09-27: qty 1

## 2015-09-27 NOTE — [facility] Group Notes (Signed)
[facility]   09/27/2015 9:15 AM  ?  Participation Quality: Alert, Appropriate and Oriented   Mood/Affect: Depressed and Flat   Depression Rating:  0  Anxiety Rating: 0  Thoughts of Suicide: Pt denies SI/HI   Will you contract for safety? Yes   Current AVH: Pt denies   Plan for Discharge/Comments: Pt attended discharge planning group and actively participated in group. CSW provided pt with today's workbook. Pt was initially involved in group sharing, but as the session progressed began to leave the room, return and then repeat leaving the room on several occasions.  Pt presented as either focused and attentive to nervous and irritable  Transportation Means: Pt reports access to transportation   Supports: Pt lists his supports as his girlfriend and his wife.    Dorothe PeaJonathan F. Reice Bienvenue, MSW, LCSWA, LCAS

## 2015-09-27 NOTE — [facility] Group Notes (Signed)
[facility] LCSW Group Therapy   09/27/2015 1:15 PM   Type of Therapy: Group Therapy   Participation Level: None  Participation Quality: Attentive at times   Affect: Depressed and Flat   Cognitive: Alert and Oriented and agitated  Insight: Developing/Improving and Engaged   Engagement in Therapy: Developing/Improving and Engaged   Modes of Intervention: Clarification, Confrontation, Discussion, Education, Exploration, Limit-setting, Orientation, Problem-solving, Rapport Building, Dance movement psychotherapisteality Testing, Socialization and Support   Summary of Progress/Problems: The topic for group today was emotional regulation. This group focused on both positive and negative emotion identification and allowed group members to process ways to identify feelings, regulate negative emotions, and find healthy ways to manage internal/external emotions. Group members were asked to reflect on a time when their reaction to an emotion led to a negative outcome and explored how alternative responses using emotion regulation would have benefited them. Group members were also asked to discuss a time when emotion regulation was utilized when a negative emotion was experienced. Pt was initially involved in group sharing, but as the session progressed began to leave the room, return and then repeat leaving the room on several occasions. Pt presented as either focused and attentive or  nervous, anxious and irritable    Dorothe PeaJonathan F. Emari Hreha, MSW, LCSWA, LCAS

## 2015-09-27 NOTE — Progress Notes (Signed)
Recreation Therapy Notes  Date: 01.04.17 Time: 3:00 pm Location: Craft Room  Group Topic: Self-esteem  Goal Area(s) Addresses:  Patient will write at least one positive trait. Patient will verbalize benefit of having a healthy self-esteem.  Behavioral Response: Did not attend  Intervention: I Am  Activity: Patients were given a worksheet with the letter I on it and instructed to write as many positive traits inside the letter as they could.  Education: LRT educated patients on ways they can increase their self-esteem.  Education Outcome: Patient did not attend group.  Clinical Observations/Feedback: Patient did not attend group.  Jacquelynn CreeGreene,Vinton Layson M, LRT/CTRS 09/27/2015 4:12 PM

## 2015-09-27 NOTE — Plan of Care (Signed)
Problem: Ineffective individual coping Goal: STG: Patient will remain free from self harm Outcome: Progressing Medications administered as ordered by the physician, Spanish in-house interpreter service explored to explain medications Therapeutic Effects, SEs and Adverse effects, questions encouraged, clarification provided; no PRN given, 15 minute checks maintained for safety, room temperature adjusted to comfort; clinical and moral support provided, patient encouraged to continue to express feelings and demonstrate safe care. Patient remain free from harm, will continue to monitor.

## 2015-09-27 NOTE — Progress Notes (Signed)
A&Ox3, Spanish speaking with limitation in [place] Language, in-house Spanish interpreter available and present during this shift; denied SI/HI, quiet mood and affect; observed wandering around, requested for jacket and water; patient is encouraged to express feelings. Clinical and moral support provided.

## 2015-09-27 NOTE — [facility] Group Notes (Signed)
[facility] Group Notes:  (Nursing/MHT/Case Management/Adjunct)  Date:  09/27/2015  Time:  6:14 PM  Type of Therapy:  Psychoeducational Skills  Participation Level:  Active  Participation Quality:  Attentive  Affect:  Appropriate  Cognitive:  Oriented  Insight:  Good  Engagement in Group:  Developing/Improving  Modes of Intervention:  Education  Summary of Progress/Problems:  Nathaniel Yates 09/27/2015, 6:14 PM

## 2015-09-27 NOTE — Progress Notes (Signed)
Pt pleasant and cooperative with care. Med and group compliant. Denies SI, HI, AVH. Interacting appropriately with staff and peers. Pt requests interpreter for any concerns needing to be addressed. Able to speak minimal english. Encouragement and support offered. Medications given as prescribed. Pt receptive. Will continue to assess and monitor for safety.

## 2015-09-27 NOTE — Plan of Care (Signed)
Problem: Ineffective individual coping Goal: STG: Patient will remain free from self harm Outcome: Progressing No self harm reported or observed     
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
## 2015-09-28 MED ORDER — PALIPERIDONE PALMITATE 234 MG/1.5ML IM SUSP
234.0000 mg | Freq: Once | INTRAMUSCULAR | Status: AC
  Administered 2015-09-28: 234 mg via INTRAMUSCULAR
  Filled 2015-09-28: qty 1.5

## 2015-09-28 MED ORDER — ADULT MULTIVITAMIN W/MINERALS CH
1.0000 | ORAL_TABLET | Freq: Every day | ORAL | Status: DC
  Administered 2015-09-28 – 2015-10-03 (×6): 1 via ORAL
  Filled 2015-09-28 (×6): qty 1

## 2015-09-28 NOTE — Progress Notes (Signed)
Recreation Therapy Notes  Date: 01.05.17 Time: 3:00 pm Location: Craft Room  Group Topic: Leisure Education  Goal Area(s) Addresses:  Patient will identify activities for each letter of the alphabet. Patient will verbalize ability to integrate positive leisure into life post d/c. Patient will verbalize ability to use leisure as a Associate Professorcoping skill.  Behavioral Response: Did not attend  Intervention: Leisure Alphabet  Activity: Patients were given a Leisure Alphabet worksheet and instructed to think of healthy leisure activities for each letter of the alphabet.  Education: LRT educated patients on what they need to participate in leisure activities.  Education Outcome: Patient did not attend group.  Clinical Observations/Feedback: Patient did not attend group.  Jacquelynn CreeGreene,Navia Lindahl M, LRT/CTRS 09/28/2015 4:33 PM

## 2015-09-28 NOTE — [facility] Group Notes (Signed)
[facility] LCSW Group Therapy   09/28/2015 1pm  Type of Therapy: Group Therapy   Participation Level: Did Not Attend. Patient invited to participate but declined.    Alithia Zavaleta F. Pecolia Marando, MSW, LCSWA, LCAS   

## 2015-09-28 NOTE — Progress Notes (Signed)
Patient is pleasant and cooperative with care. Med and group compliant. Appropriate with staff and peers. Denies SI, HI, AVH. Reports he is ready to go home. Interacts appropriately with staff and peers. Encouraged pt to attend group. Will continue to assess and monitor for safety.
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
## 2015-09-28 NOTE — Plan of Care (Signed)
Problem: Ineffective individual coping Goal: LTG: Patient will report a decrease in negative feelings Outcome: Progressing Pt states he had a "good day".  Goal: STG: Patient will remain free from self harm Outcome: Progressing No self harm.  Goal: STG-Increase in ability to manage activities of daily living Outcome: Progressing Patient able to complete ADL's and behave appropriately in the milieu.

## 2015-09-28 NOTE — Progress Notes (Signed)
AAOX3. Spanish speaking with limitation in english language. Med compliant. Attends group. Limited interaction with peers and staff. C/o neck pain. Tylenol 650 mg po given at 2145 PRN for pain. Denies SI/HI/AV/H. No behavior problems noted. Slept 9 hours.

## 2015-09-28 NOTE — [facility] Group Notes (Signed)
[facility] Group Notes:  (Nursing/MHT/Case Management/Adjunct)  Date:  09/28/2015  Time:  4:01 AM  Type of Therapy:  Group Therapy  Participation Level:  Minimal  Participation Quality:  Attentive  Affect:  Appropriate  Cognitive:  Alert and Appropriate  Insight:  Appropriate  Engagement in Group:  Engaged  Modes of Intervention:  Discussion  Summary of Progress/Problems:  Nathaniel Yates Nathaniel Yates 09/28/2015, 4:01 AM
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
## 2015-09-28 NOTE — [facility] Group Notes (Signed)
[facility] Group Notes:  (Nursing/MHT/Case Management/Adjunct)  Date:  09/28/2015  Time:  2:53 PM  Type of Therapy:  Psychoeducational Skills  Participation Level:  None  Participation Quality:  Attentive  Affect:  Flat  Cognitive:  Appropriate  Insight:  Appropriate  Engagement in Group:  None and left early   Modes of Intervention:  Education  Summary of Progress/Problems:  [place] 09/28/2015, 2:53 PM

## 2015-09-28 NOTE — [facility] Group Notes (Signed)
[facility] Group Notes:  (Nursing/MHT/Case Management/Adjunct)  Date:  09/28/2015  Time:  11:27 PM  Type of Therapy:  Evening Wrap-up Group  Participation Level:  Did Not Attend  Participation Quality:  N/A  Affect:  N/A  Cognitive:  N/A  Insight:  None  Engagement in Group:  N/A  Modes of Intervention:  Discussion  Summary of Progress/Problems:  Tomasita MorrowChelsea Nanta Stacey Maura 09/28/2015, 11:27 PM
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
## 2015-09-28 NOTE — Plan of Care (Signed)
Problem: Ineffective individual coping Goal: LTG: Patient will report a decrease in negative feelings Outcome: Progressing Pt states feeling better

## 2015-09-29 MED ORDER — LORAZEPAM 0.5 MG PO TABS
0.5000 mg | ORAL_TABLET | Freq: Two times a day (BID) | ORAL | Status: DC
  Administered 2015-09-29 – 2015-10-03 (×9): 0.5 mg via ORAL
  Filled 2015-09-29 (×9): qty 1

## 2015-09-29 MED ORDER — ATENOLOL 25 MG PO TABS
25.0000 mg | ORAL_TABLET | Freq: Every day | ORAL | Status: DC
  Administered 2015-09-29 – 2015-10-03 (×5): 25 mg via ORAL
  Filled 2015-09-29 (×5): qty 1

## 2015-09-29 MED ORDER — LITHIUM CARBONATE ER 450 MG PO TBCR
450.0000 mg | EXTENDED_RELEASE_TABLET | Freq: Two times a day (BID) | ORAL | Status: DC
  Administered 2015-09-29 – 2015-10-03 (×8): 450 mg via ORAL
  Filled 2015-09-29 (×11): qty 1

## 2015-09-29 NOTE — Progress Notes (Signed)
Recreation Therapy Notes  Date: 01.06.17 Time: 3:00 pm Location: Craft Room  Group Topic: Coping Skills  Goal Area(s) Addresses:  Patient will participate in healthy coping skill.  Behavioral Response: Did not attend  Intervention: Coloring   Activity: Patients were given coloring sheets and instructed to color and think of what emotions they were feeling and what they were focused on.  Education: LRT educated patients on healthy coping skills.  Education Outcome: Patient did not attend group.  Clinical Observations/Feedback: Patient did not attend group.  Librado Guandique M, LRT/CTRS 09/29/2015 4:46 PM 

## 2015-09-29 NOTE — [facility] Group Notes (Signed)
[facility] LCSW Group Therapy   09/29/2015 1:15 PM   Type of Therapy: Group Therapy   Participation Level: Active   Participation Quality: Attentive at times  Affect: Appropriate  Cognitive: Alert and Oriented   Insight: Developing/Improving and Engaged   Engagement in Therapy: Developing/Improving and Engaged   Modes of Intervention: Clarification, Confrontation, Discussion, Education, Exploration, Limit-setting, Orientation, Problem-solving, Rapport Building, Dance movement psychotherapisteality Testing, Socialization and Support   Summary of Progress/Problems: The topic for today was feelings about relapse. Pt discussed what relapse prevention is to them and identified triggers that they are on the path to relapse. Pt processed their feeling towards relapse and was able to relate to peers. Pt discussed coping skills that can be used for relapse prevention.  Pt shared he was homesick and wanted to see his family.  Pt shared he coped by enjoying his meals.  Pt did not share at length and left the session intermittently and returned intermittently.      Dorothe PeaJonathan F. Davidlee Jeanbaptiste, MSW, LCSWA, LCAS

## 2015-09-29 NOTE — Progress Notes (Signed)
Pleasant and cooperative. Minimal English, but able to communicate adequately. Flooded bathroom earlier in the evening but did clean up completely. Denies any needs or distress. Med compliant and pleasant.
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
## 2015-09-29 NOTE — [facility] Group Notes (Signed)
[facility]   09/29/2015 9:15 AM  ?  Participation Quality: Alert, Appropriate and Oriented   Mood/Affect: Appropriate  Depression Rating: 2-3  Anxiety Rating: 0  Thoughts of Suicide: Pt denies SI/HI   Will you contract for safety? Yes   Current AVH: Pt denies   Plan for Discharge/Comments: Pt attended discharge planning group and actively participated in group. CSW provided pt with today's workbook. Pt shared he will be returning to [place] to live with his girlfriend and his two children and return to work.Marland Kitchen.  Pt plans to follow up with [facility] [place] for outpatient services, medication management and therapy.  Transportation Means: Pt reports access to transportation from his girlfriend  Supports: Pt lists family as primary supports     Dorothe PeaJonathan F. Mikylah Ackroyd, MSW, LCSWA, LCAS

## 2015-09-30 LAB — LITHIUM LEVEL: Lithium Lvl: 0.25 mmol/L — ABNORMAL LOW (ref 0.60–1.20)

## 2015-09-30 NOTE — Plan of Care (Signed)
Problem: Alteration in mood; excessive anxiety as evidenced by: Goal: LTG-Patient's behavior demonstrates decreased anxiety (Patient's behavior demonstrates anxiety and he/she is utilizing learned coping skills to deal with anxiety-producing situations)  Outcome: Progressing Pleasant and cooperative.  Visible in milieu.   Medication and group compliant.

## 2015-09-30 NOTE — [facility] Group Notes (Signed)
[facility] LCSW Group Therapy  09/30/2015 2:27 PM  Type of Therapy:  Group Therapy  Participation Level:  Minimal  Participation Quality:  Attentive  Affect:  Appropriate  Cognitive:  Alert  Insight:  Improving  Engagement in Therapy:  Improving  Modes of Intervention:  Discussion, Education, Socialization and Support  Summary of Progress/Problems: Balance in life: Patients will discuss the concept of balance and how it looks and feels to be unbalanced. Pt will identify areas in their life that is unbalanced and ways to become more balanced.  [place] attended group but did not stay long. He states he is feeling better today due to the snow.   Sempra EnergyCandace L Tarrin Lebow MSW, LCSWA  09/30/2015, 2:27 PM

## 2015-09-30 NOTE — Progress Notes (Signed)
Patient is pleasant and cooperative.   Denies SI or depression. No bizarre behavior noted.  Visible in milieu.  Minimum interaction with peers due to language barrier.

## 2015-09-30 NOTE — Plan of Care (Signed)
Problem: Ineffective individual coping Goal: LTG: Patient will report a decrease in negative feelings Outcome: Progressing Patient reports feeling less depressed and reports no SI or HI.  Goal: STG: Patient will remain free from self harm Outcome: Progressing No self harm.

## 2015-10-01 NOTE — [facility] Group Notes (Signed)
[facility] LCSW Group Therapy  10/01/2015 2:26 PM  Type of Therapy:  Group Therapy  Participation Level:  Did Not Attend  Modes of Intervention:  Discussion, Education, Socialization and Support  Summary of Progress/Problems: Todays topic: Grudges  Patients will be encouraged to discuss their thoughts, feelings, and behaviors as to why one holds on to grudges and reasons why people have grudges. Patients will process the impact of grudges on their daily lives and identify thoughts and feelings related to holding grudges. Patients will identify feelings and thoughts related to what life would look like without grudges.   Jamerion Cabello L Ainsleigh Kakos MSW, LCSWA  10/01/2015, 2:26 PM   
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
## 2015-10-01 NOTE — Plan of Care (Signed)
Problem: Ineffective individual coping Goal: LTG: Patient will report a decrease in negative feelings Outcome: Progressing Pt reports decrease in negative feelings.  Goal: STG: Patient will remain free from self harm Outcome: Progressing No self harm.  Goal: STG-Increase in ability to manage activities of daily living Outcome: Progressing Pt able to manage activities of daily living.
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
## 2015-10-01 NOTE — Plan of Care (Signed)
Problem: Alteration in mood; excessive anxiety as evidenced by: Goal: LTG-Patient's behavior demonstrates decreased anxiety (Patient's behavior demonstrates anxiety and he/she is utilizing learned coping skills to deal with anxiety-producing situations)  Outcome: Progressing Pleasant and cooperative.  Visible in milieu.  No signs of anxiety noted.

## 2015-10-01 NOTE — Progress Notes (Signed)
Patient was pleasant and cooperative in the milieu. He was able to speak with one the spanish speaking staff and noted no needs or distress. He requested to shave and was able to do so without incident. He is medication compliant. He notes no needs or distress and denies SI, HI, and AVH.

## 2015-10-02 MED ORDER — PALIPERIDONE ER 9 MG PO TB24: 9.0000 mg | ORAL_TABLET | Freq: Every day | ORAL | Status: AC

## 2015-10-02 MED ORDER — ATENOLOL 25 MG PO TABS: 25.0000 mg | ORAL_TABLET | Freq: Every day | ORAL | Status: AC

## 2015-10-02 MED ORDER — ATENOLOL 25 MG PO TABS: 25.0000 mg | ORAL_TABLET | Freq: Every day | ORAL | Status: DC

## 2015-10-02 MED ORDER — LITHIUM CARBONATE ER 450 MG PO TBCR: 450.0000 mg | EXTENDED_RELEASE_TABLET | Freq: Two times a day (BID) | ORAL | Status: AC

## 2015-10-02 MED ORDER — PALIPERIDONE ER 9 MG PO TB24: 9.0000 mg | ORAL_TABLET | Freq: Every day | ORAL | Status: DC

## 2015-10-02 MED ORDER — LORAZEPAM 2 MG PO TABS: 2.0000 mg | ORAL_TABLET | Freq: Every day | ORAL | Status: AC

## 2015-10-02 MED ORDER — LITHIUM CARBONATE ER 450 MG PO TBCR: 450.0000 mg | EXTENDED_RELEASE_TABLET | Freq: Two times a day (BID) | ORAL | Status: DC

## 2015-10-02 MED ORDER — PALIPERIDONE PALMITATE 156 MG/ML IM SUSP: 156.0000 mg | INTRAMUSCULAR | Status: AC

## 2015-10-02 MED ORDER — WHITE PETROLATUM GEL
Status: AC
  Administered 2015-10-02: 16:00:00
  Filled 2015-10-02: qty 5

## 2015-10-02 MED ORDER — PALIPERIDONE PALMITATE 156 MG/ML IM SUSP
156.0000 mg | INTRAMUSCULAR | Status: DC
Start: 2015-10-02 — End: 2015-10-03
  Administered 2015-10-02: 156 mg via INTRAMUSCULAR
  Filled 2015-10-02: qty 1

## 2015-10-02 NOTE — Progress Notes (Signed)
Patient denies SI/HI/AVH. Patient states that he is feeling "normal". Patient is med compliant and voices no concerns during shift. Patient states that he is "happy". Patient slept 5.25 hours and paces quietly. Will continue to monitor.

## 2015-10-02 NOTE — [facility] Group Notes (Signed)
[facility] Group Notes:  (Nursing/MHT/Case Management/Adjunct)  Date:  10/02/2015  Time:  12:39 PM  Type of Therapy:  Psychoeducational Skills  Participation Level:  None  Pt came to group and listened then left and came back towards the end of group.     Marquette OldAmanda Lea Campbell 10/02/2015, 12:39 PM

## 2015-10-02 NOTE — [facility] Suicide Risk Assessment (Addendum)
[facility]   Demographic Factors:  Male and Low socioeconomic status  Total Time spent with patient: 45 minutes    Psychiatric Specialty Exam: Physical Exam  ROS  Have you used any form of tobacco in the last 30 days? (Cigarettes, Smokeless Tobacco, Cigars, and/or Pipes): Patient Refused Screening  Has this patient used any form of tobacco in the last 30 days? (Cigarettes, Smokeless Tobacco, Cigars, and/or Pipes) No  Mental Status Per Nursing Assessment::   On Admission:     Current Mental Status by Physician: Patient reports feeling hopeful, future oriented. Denies SI, HI. Patient is not agitated or aggressive. He has been sleeping well. He is tolerating the medications well. His mood is stable. There is no psychosis, paranoia. He is does not appear manic  Loss Factors: Loss of significant relationship and Financial problems/change in socioeconomic status  Historical Factors: Family history of suicide  Risk Reduction Factors:   Responsible for children under 46 years of age, Employed, Living with another person, especially a relative and Positive social support  Continued Clinical Symptoms:  Previous Psychiatric Diagnoses and Treatments  Cognitive Features That Contribute To Risk:  None    Suicide Risk:  Minimal: No identifiable suicidal ideation.  Patients presenting with no risk factors but with morbid ruminations; may be classified as minimal risk based on the severity of the depressive symptoms  Principal Problem: Bipolar 1 disorder, manic, moderate (HCC) Discharge Diagnoses:  Patient Active Problem List   Diagnosis Date Noted  . Bipolar 1 disorder, manic, moderate (HCC) [F31.12] 09/25/2015  . Hypertension [I10] 09/24/2015    Follow-up Information    [place] [facility] [place]   Why:  Please arrive at their walk-in clinic between the hours of 8am-2:30pm and go to the top floor for your assessment for medication management and  therapy.  Please arrive as early as possible for prompt service   Contact information:   [address] (next to [facility]) (10 blocks from downtown) [phone] Fax: [phone]       Is patient on multiple antipsychotic therapies at discharge:  No   Has Patient had three or more failed trials of antipsychotic monotherapy by history:  No  Recommended Plan for Multiple Antipsychotic Therapies: NA    Jimmy FootmanHernandez-Gonzalez,  Nicholaos Schippers 10/02/2015, 4:02 PM
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
## 2015-10-02 NOTE — Progress Notes (Signed)
Recreation Therapy Notes  Date: 01.09.17 Time: 3:00 pm Location: Craft Room  Group Topic: Self-expression  Goal Area(s) Addresses:  Patient will identify one color per emotion listed on wheel. Patient will verbalize benefit of using art as a means of self-expression. Patient will verbalize one emotion experienced during session. Patient will be educated on other forms of self-expression.  Behavioral Response: Attentive, Left early  Intervention: Emotion Wheel  Activity: Patients were given a worksheet with 7 emotions on it and instructed to pick a color for each emotion.  Education: LRT educated patients on different forms of self-expression.  Education Outcome: Patient left before LRT educated group.  Clinical Observations/Feedback: Patient completed activity by picking a color for each emotion. Patient left group at approximately 3:24 pm. Patient returned to group at approximately 3:28 pm. Patient did not contribute to group discussion. Patient left group at approximately 3:43 pm.  Jacquelynn CreeGreene,Jearldean Gutt M, LRT/CTRS 10/02/2015 4:37 PM

## 2015-10-02 NOTE — [facility] Group Notes (Signed)
[facility] Group Notes:  (Nursing/MHT/Case Management/Adjunct)  Date:  10/02/2015  Time:  3:53 AM  Type of Therapy:  Group Therapy  Participation Level:  Active  Participation Quality:  Appropriate  Affect:  Appropriate  Cognitive:  Appropriate  Insight:  Appropriate and Improving  Engagement in Group:  Engaged  Modes of Intervention:  n/a  Summary of Progress/Problems:  Nathaniel Yates 10/02/2015, 3:53 AM
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
## 2015-10-03 LAB — LITHIUM LEVEL: Lithium Lvl: 0.6 mmol/L (ref 0.60–1.20)

## 2015-10-03 NOTE — Progress Notes (Signed)
  [facility] Discharge [facility] :  Will you be returning to the same living situation after discharge:  Yes,  pt will be returning home to live with his girlfriend and two children [place], [place] At discharge, do you have transportation home?: Yes,  pt will be picked up ny his girlfriend Do you have the ability to pay for your medications: Yes,  pt will be provided with prescriptions at discharge.  Release of information consent forms completed and in the chart;  Patient's signature needed at discharge.  Patient to Follow up at: Follow-up Information    Follow up with [facility] [place], [place].   Why:  Please arrive at their walk-in clinic at 10am on Friday 10/06/15 and go to the top floor to [facility] for your assessment for medication management and therapy.  Call and dial extension [phone] two days before you go to put in a request for an interpreter.   Contact information:   [address] [place] [address] (next to [facility]) (10 blocks from downtown) 2nd floor Ph:   [phone] Fax: [phone] Fax: [phone]      Next level of care provider has access to [facility] Link:no  Safety Planning and Suicide Prevention discussed: Yes,  completed with pt and family  Have you used any form of tobacco in the last 30 days? (Cigarettes, Smokeless Tobacco, Cigars, and/or Pipes): Patient Refused Screening  Has patient been referred to the Quitline?: Patient refused referral  Patient has been referred for addiction treatment: N/A  Nathaniel PeaJonathan F Sagrario Yates 10/03/2015, 11:55 AM
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
## 2015-10-03 NOTE — Plan of Care (Signed)
Problem: Alteration in mood; excessive anxiety as evidenced by: Goal: STG-Pt will report an absence of self-harm thoughts/actions (Patient will report an absence of self-harm thoughts or actions)  Outcome: Progressing Pt has not harmed self and denies SI

## 2015-10-03 NOTE — Progress Notes (Signed)
Discharged at this time to wife and family. Verbalizes understanding rt recommended discharge plan of care. NO SI/HI/AVH at this time. Patient acknowledges all belongings have been returned. Safety maintained.

## 2015-10-03 NOTE — Progress Notes (Signed)
D: Pt is awake and active in the milieu this evening. Pt mood is sad and his affect is depressed. Pt denies SI/HI and AVH at this time. Pt wanders the halls at times but is interacting appropriately with staff and peers.   A: Writer provided emotional support and administered medications as prescribed.   R: Pt is taking medications as prescribed and is pleasant and cooperative with staff.

## 2015-10-03 NOTE — Progress Notes (Signed)
Patient with appropriate affect, cooperative behavior with meals, meds and plan of care. No SI/HI/AVH at this time. Minimal interaction with peers. Verbalizes needs appropriately with staff. Completes daily audit sheet with discharge as his goal. MD into visit and patient to discharge when discharge plan and transportation in place. Safety maintained.

## 2016-03-15 ENCOUNTER — Ambulatory Visit: Payer: Self-pay | Admitting: Cardiology

## 2017-09-27 IMAGING — CT CT HEAD W/O CM
2 series · 14 of 30 positions shown, 16 images · non-contrast
Comparison: None.

CLINICAL DATA: Altered mental status.

EXAM:
CT HEAD WITHOUT CONTRAST
TECHNIQUE: Contiguous axial images were obtained from the base of the skull
through the vertex without intravenous contrast.

[Series 2: head wo · axial · 0.40mm/px · z∈[+594,+704]mm · 6 of 32 slices shown, 8 images]
[im 5/32  brain]
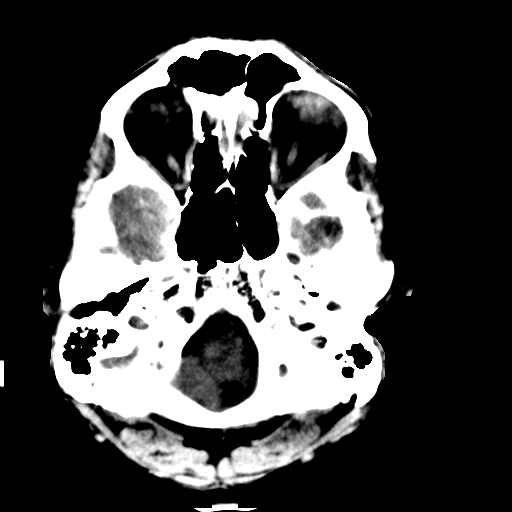
[im 5/32  bone]
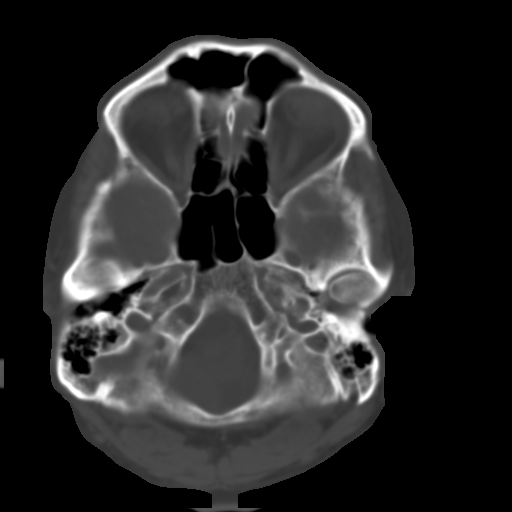
[im 9/32  brain]
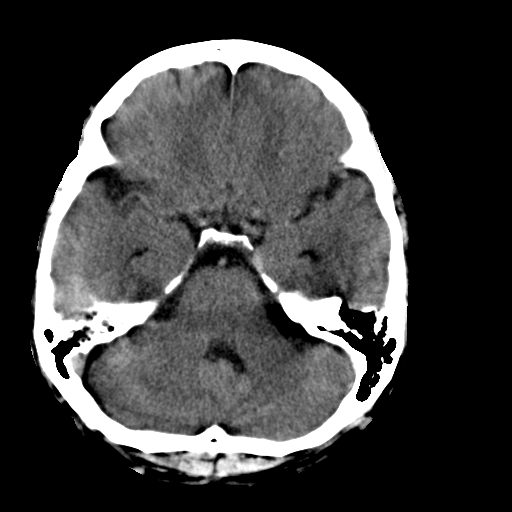
[im 14/32  brain]
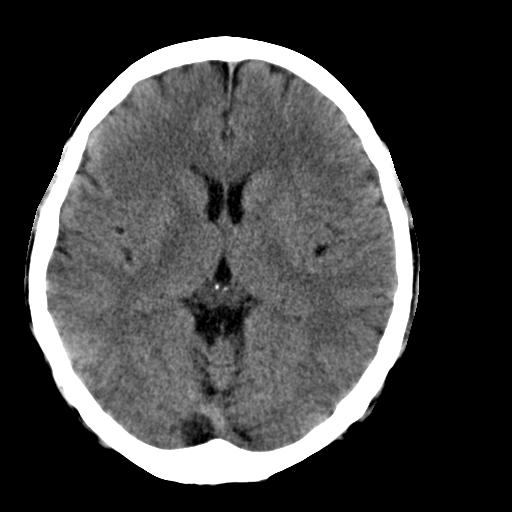
[im 18/32  brain]
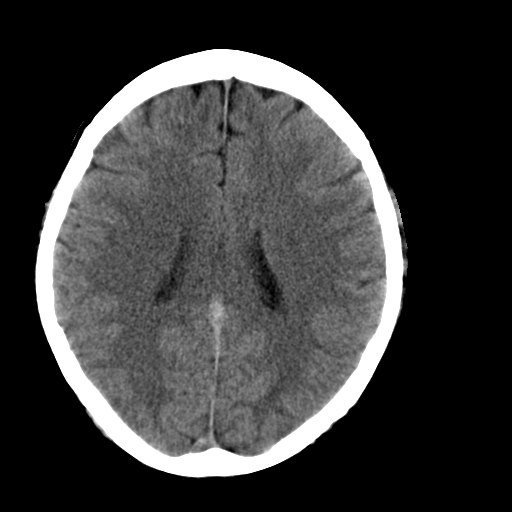
[im 23/32  brain]
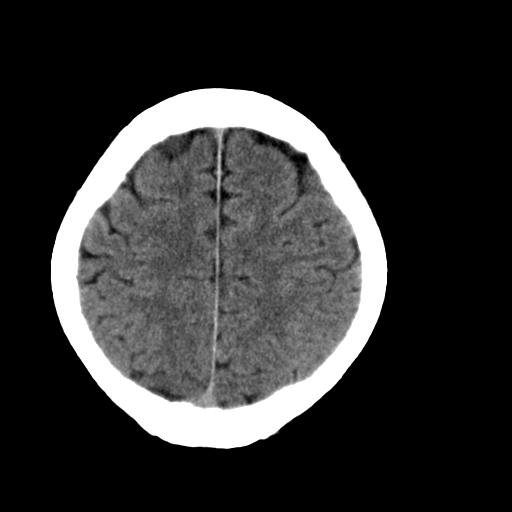
[im 23/32  bone]
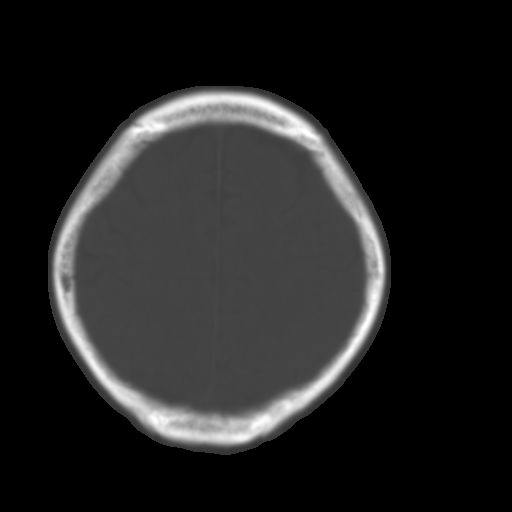
[im 27/32  brain]
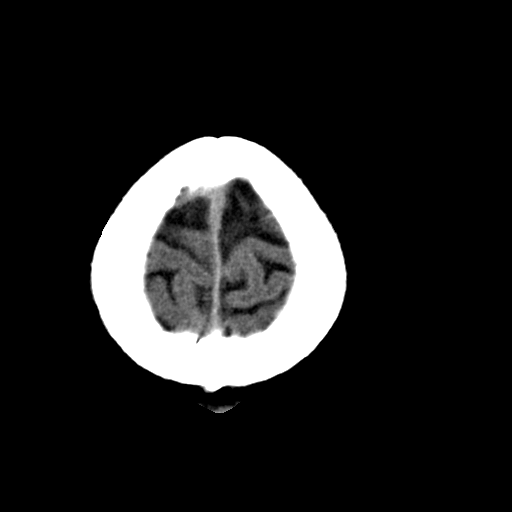

[Series 3: head bone · axial · 0.40mm/px · z∈[+580,+722]mm · 8 of 89 slices shown]
[im 9/89  bone]
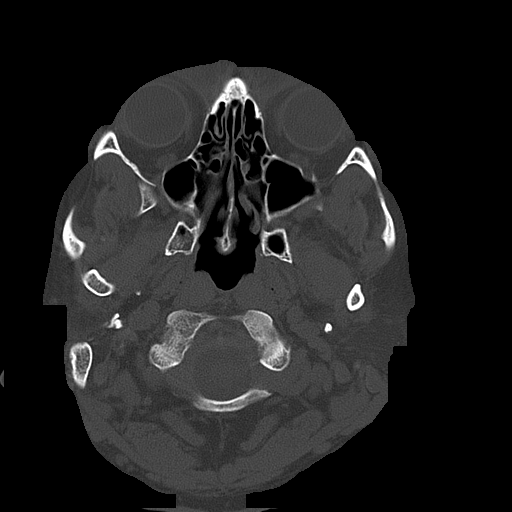
[im 17/89  bone]
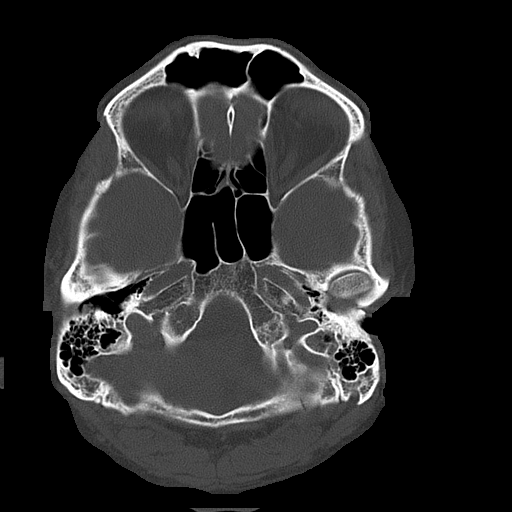
[im 30/89  bone]
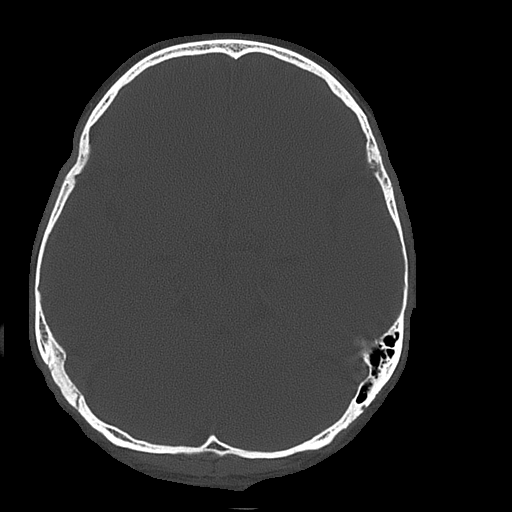
[im 38/89  bone]
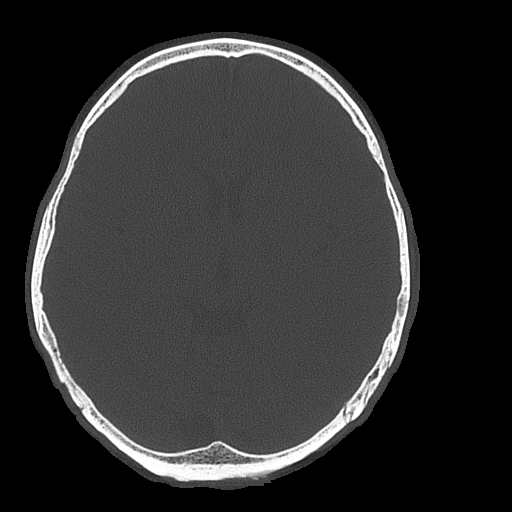
[im 51/89  bone]
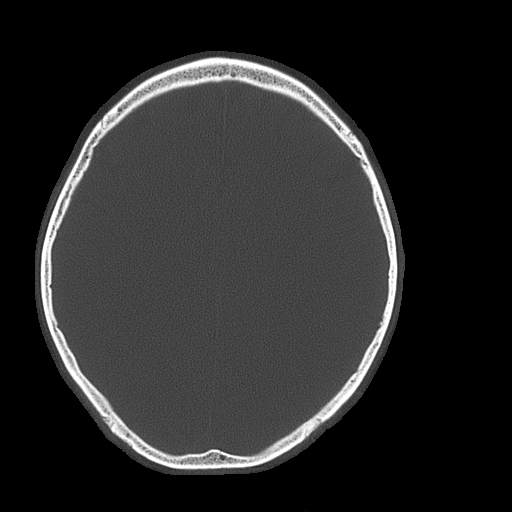
[im 59/89  bone]
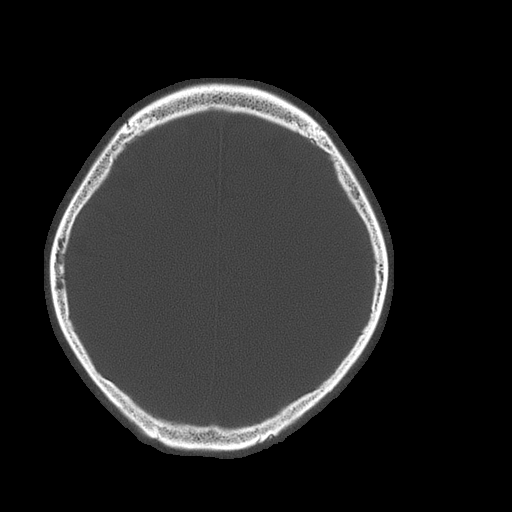
[im 72/89  bone]
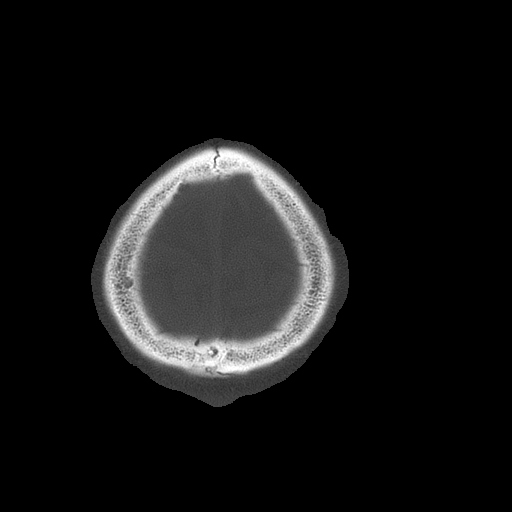
[im 80/89  bone]
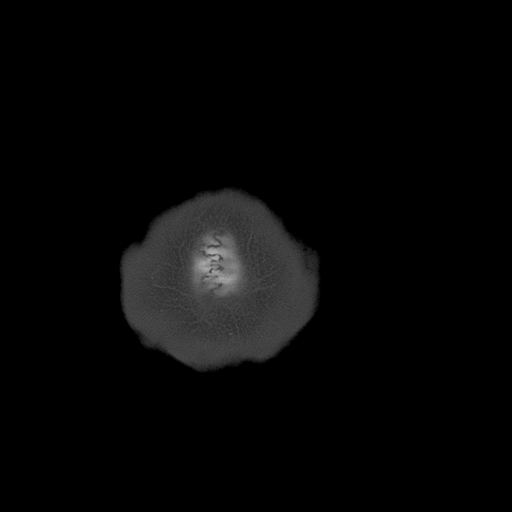

[14 of 30 positions shown; findings below may reference images not displayed]

FINDINGS: Sinuses/Soft tissues: Left maxillary sinus mucous retention cyst or
polyp. Clear mastoid air cells.

Intracranial: No mass lesion, hemorrhage, hydrocephalus, acute
infarct, intra-axial, or extra-axial fluid collection.
IMPRESSION: No acute intracranial abnormality.

Sinus disease.
